# Patient Record
Sex: Male | Born: 2000 | Race: White | Hispanic: No | Marital: Single | Smoking: Never smoker
Health system: Southern US, Community
[De-identification: ages and names within clinical notes are randomized; demographics above are authoritative.]

## PROBLEM LIST (undated history)

## (undated) DIAGNOSIS — F909 Attention-deficit hyperactivity disorder, unspecified type: Secondary | ICD-10-CM

## (undated) HISTORY — DX: Attention-deficit hyperactivity disorder, unspecified type: F90.9

---
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
## 2018-01-20 DIAGNOSIS — R17 Unspecified jaundice: Secondary | ICD-10-CM | POA: Diagnosis not present

## 2018-01-20 DIAGNOSIS — J039 Acute tonsillitis, unspecified: Secondary | ICD-10-CM | POA: Diagnosis not present

## 2018-01-21 ENCOUNTER — Emergency Department (HOSPITAL_COMMUNITY)
Admission: EM | Admit: 2018-01-21 | Discharge: 2018-01-21 | Disposition: A | Discharge status: Home / Self Care | Payer: 59 | Attending: Emergency Medicine | Admitting: Emergency Medicine

## 2018-01-21 ENCOUNTER — Encounter (HOSPITAL_COMMUNITY): Payer: Self-pay

## 2018-01-21 ENCOUNTER — Other Ambulatory Visit: Payer: Self-pay

## 2018-01-21 DIAGNOSIS — R17 Unspecified jaundice: Secondary | ICD-10-CM | POA: Insufficient documentation

## 2018-01-21 DIAGNOSIS — J029 Acute pharyngitis, unspecified: Secondary | ICD-10-CM | POA: Diagnosis not present

## 2018-01-21 DIAGNOSIS — R070 Pain in throat: Secondary | ICD-10-CM | POA: Diagnosis not present

## 2018-01-21 LAB — CBC WITH DIFFERENTIAL/PLATELET
Abs Immature Granulocytes: 0.07 10*3/uL (ref 0.00–0.07)
BASOS ABS: 0 10*3/uL (ref 0.0–0.1)
BASOS PCT: 0 %
EOS ABS: 0.1 10*3/uL (ref 0.0–0.5)
Eosinophils Relative: 0 %
HCT: 48.6 % (ref 39.0–52.0)
Hemoglobin: 15.6 g/dL (ref 13.0–17.0)
IMMATURE GRANULOCYTES: 1 %
Lymphocytes Relative: 15 %
Lymphs Abs: 2.2 10*3/uL (ref 0.7–4.0)
MCH: 29.8 pg (ref 26.0–34.0)
MCHC: 32.1 g/dL (ref 30.0–36.0)
MCV: 92.9 fL (ref 80.0–100.0)
MONOS PCT: 11 %
Monocytes Absolute: 1.6 10*3/uL — ABNORMAL HIGH (ref 0.1–1.0)
NEUTROS PCT: 73 %
NRBC: 0 % (ref 0.0–0.2)
Neutro Abs: 10.4 10*3/uL — ABNORMAL HIGH (ref 1.7–7.7)
PLATELETS: 312 10*3/uL (ref 150–400)
RBC: 5.23 MIL/uL (ref 4.22–5.81)
RDW: 12.1 % (ref 11.5–15.5)
WBC: 14.3 10*3/uL — ABNORMAL HIGH (ref 4.0–10.5)

## 2018-01-21 LAB — COMPREHENSIVE METABOLIC PANEL
ALK PHOS: 90 U/L (ref 38–126)
ALT: 25 U/L (ref 0–44)
ANION GAP: 14 (ref 5–15)
AST: 19 U/L (ref 15–41)
Albumin: 4 g/dL (ref 3.5–5.0)
BILIRUBIN TOTAL: 3.6 mg/dL — AB (ref 0.3–1.2)
BUN: 13 mg/dL (ref 6–20)
CALCIUM: 9.9 mg/dL (ref 8.9–10.3)
CO2: 23 mmol/L (ref 22–32)
Chloride: 100 mmol/L (ref 98–111)
Creatinine, Ser: 1.1 mg/dL (ref 0.61–1.24)
GFR calc non Af Amer: >60 mL/min (ref >60)
Glucose, Bld: 91 mg/dL (ref 70–99)
Potassium: 3.8 mmol/L (ref 3.5–5.1)
Sodium: 137 mmol/L (ref 135–145)
TOTAL PROTEIN: 7.6 g/dL (ref 6.5–8.1)

## 2018-01-21 LAB — MONONUCLEOSIS SCREEN: Mono Screen: NEGATIVE

## 2018-01-21 LAB — I-STAT CG4 LACTIC ACID, ED: Lactic Acid, Venous: 1.32 mmol/L (ref 0.5–1.9)

## 2018-01-21 MED ORDER — MAGIC MOUTHWASH
10.0000 mL | Freq: Once | ORAL | Status: AC
  Administered 2018-01-21: 10 mL via ORAL
  Filled 2018-01-21 (×2): qty 10

## 2018-01-21 MED ORDER — DEXAMETHASONE SODIUM PHOSPHATE 10 MG/ML IJ SOLN
10.0000 mg | Freq: Once | INTRAMUSCULAR | Status: AC
  Administered 2018-01-21: 10 mg via INTRAVENOUS
  Filled 2018-01-21: qty 1

## 2018-01-21 MED ORDER — IBUPROFEN 600 MG PO TABS: 600.0000 mg | ORAL_TABLET | Freq: Four times a day (QID) | ORAL | 0 refills | Status: DC | PRN

## 2018-01-21 MED ORDER — KETOROLAC TROMETHAMINE 15 MG/ML IJ SOLN
15.0000 mg | Freq: Once | INTRAMUSCULAR | Status: AC
  Administered 2018-01-21: 15 mg via INTRAVENOUS
  Filled 2018-01-21: qty 1

## 2018-01-21 NOTE — ED Triage Notes (Signed)
Pt c/o sore throat since X4-5 days, pt was sent her by MD after lab draw for elevated WBC.

## 2018-01-21 NOTE — ED Notes (Signed)
Patient verbalizes understanding of discharge instructions. Opportunity for questioning and answers were provided. Armband removed by staff, pt discharged from ED home via POV.  

## 2018-01-21 NOTE — ED Provider Notes (Signed)
[facility]PART[facility] Provider Note   CSN: [id number] Arrival date & time: 01/21/18  1629     History   Chief Complaint Chief Complaint  Patient presents with  . Sore Throat    HPI Daniel Floyd is a 17 y.o. male.  HPI  17 y/o comes in with cc of sore throat. About 3 days ago patient started having headaches, fevers, sore throat. Pt has been taking OTC medicines w/o significant relief. Sore throat started y'day, and the pain is severe leading to reduced po intake. Pt has no cough, no dib, sick contacts, voice change.   Pt has hx of ADHD but no other medical problem.  Past Medical History:  Diagnosis Date  . ADHD (attention deficit hyperactivity disorder)     There are no active problems to display for this patient.   History reviewed. No pertinent surgical history.      Home Medications    Prior to Admission medications   Medication Sig Start Date End Date Taking? Authorizing Provider  acetaminophen (TYLENOL) 500 MG tablet Take 500 mg by mouth every 6 (six) hours as needed for moderate pain or fever.    Yes [provider]  fluticasone (FLONASE) 50 MCG/ACT nasal spray Place 2 sprays into both nostrils daily. Patient taking differently: Place 2 sprays into both nostrils daily as needed for allergies.  06/01/15  Yes Posey Boyer, MD  ibuprofen (ADVIL,MOTRIN) 600 MG tablet Take 1 tablet (600 mg total) by mouth every 6 (six) hours as needed. 01/21/18   Varney Biles, MD    Family History No family history on file.  Social History Social History   Tobacco Use  . Smoking status: Never Smoker  . Smokeless tobacco: Never Used  Substance Use Topics  . Alcohol use: No  . Drug use: No     Allergies   Patient has no known allergies.   Review of Systems Review of Systems  Constitutional: Positive for activity change and fever.  HENT: Positive for sore throat and trouble swallowing. Negative for drooling, facial  swelling and voice change.   Respiratory: Negative for cough.   Gastrointestinal: Negative for nausea and vomiting.  Allergic/Immunologic: Negative for immunocompromised state.     Physical Exam Updated Vital Signs BP 125/80   Pulse 84   Temp (S) 99.6 F (37.6 C) (Oral) Comment: Pt has had tylenol reported fever at home of 103   Resp 18   Ht 6\' 6"  (1.981 m)   Wt 106.6 kg   SpO2 100%   BMI 27.16 kg/m   Physical Exam  Constitutional: He is oriented to person, place, and time. He appears well-developed.  HENT:  Head: Normocephalic.  Mouth/Throat: Mucous membranes are normal. No uvula swelling. Oropharyngeal exudate and posterior oropharyngeal erythema present. No posterior oropharyngeal edema or tonsillar abscesses.  No meningismus  Eyes: Pupils are equal, round, and reactive to light. EOM are normal.  Icteric sclera  Cardiovascular: Normal rate.  Abdominal: Soft. He exhibits no mass. There is no tenderness.  Lymphadenopathy:    He has cervical adenopathy.  Neurological: He is alert and oriented to person, place, and time.  Skin: Skin is warm.  Nursing note and vitals reviewed.    ED Treatments / Results  Labs (all labs ordered are listed, but only abnormal results are displayed) Labs Reviewed  COMPREHENSIVE METABOLIC PANEL - Abnormal; Notable for the following components:      Result Value   Total Bilirubin 3.6 (*)  All other components within normal limits  CBC WITH DIFFERENTIAL/PLATELET - Abnormal; Notable for the following components:   WBC 14.3 (*)    Neutro Abs 10.4 (*)    Monocytes Absolute 1.6 (*)    All other components within normal limits  MONONUCLEOSIS SCREEN  HEPATITIS PANEL, ACUTE  I-STAT CG4 LACTIC ACID, ED  I-STAT CG4 LACTIC ACID, ED    EKG None  Radiology No results found.  Procedures Procedures (including critical care time)  Medications Ordered in ED Medications  magic mouthwash (10 mLs Oral Given 01/21/18 2048)  dexamethasone  (DECADRON) injection 10 mg (10 mg Intravenous Given 01/21/18 2047)  ketorolac (TORADOL) 15 MG/ML injection 15 mg (15 mg Intravenous Given 01/21/18 2057)     Initial Impression / Assessment and Plan / ED Course  I have reviewed the triage vital signs and the nursing notes.  Pertinent labs & imaging results that were available during my care of the patient were reviewed by me and considered in my medical decision making (see chart for details).     DDX includes: Viral syndrome Influenza Pharyngitis Sinusitis Mononucleosis Hepatitis   17 year old healthy male brought into the ER with chief complaint of abnormal lab.  Patient has been having sore throat and some URI-like symptoms.  He has been started on antibiotics.  He is noted to have slight exudate in the left oropharynx.  Patient was informed to come to the ER after his white count was noted to be over 24,000 along with bilirubin over 5.  Patient's sclera is icteric.  His HEENT bilirubin today is improved along with a white count. There is no meningismus, headaches, mental status change, abdominal tenderness.  Mono has been resent.  Patient is nontoxic-appearing and immunocompetent.  We will send hepatitis panel which can present with URI-like symptoms as well, however at this time this appears to be a sequela of viral infection.  LFTs are reassuring outside of bilirubin. we do not think patient is having hemolysis.  Patient and family have been briefed on the findings and the concern we have.  They will see the PCP in 1 week for repeat blood work.  Stable for discharge at this time.  Final Clinical Impressions(s) / ED Diagnoses   Final diagnoses:  Viral pharyngitis  Jaundice    ED Discharge Orders         Ordered    ibuprofen (ADVIL,MOTRIN) 600 MG tablet  Every 6 hours PRN     01/21/18 2117           Varney Biles, MD 01/23/18 1633

## 2018-01-21 NOTE — Discharge Instructions (Signed)
We suspect that your symptoms of jaundice is because of viral pharyngitis. We have sent out hepatitis panel, however at this time the suspicion is high for viral infection causing the symptoms.  We would like you to see your doctor in 1 week for repeat bilirubin check.

## 2018-01-21 NOTE — ED Notes (Signed)
Pt 's mom st's pt has not been able to eat in several days due to sore throat.  St's she was also told to bring him to ED for IV fluids

## 2018-01-23 LAB — HEPATITIS PANEL, ACUTE
HEP A IGM: NEGATIVE
Hep B C IgM: NEGATIVE
Hepatitis B Surface Ag: NEGATIVE

## 2018-01-25 DIAGNOSIS — R17 Unspecified jaundice: Secondary | ICD-10-CM | POA: Diagnosis not present

## 2018-01-25 DIAGNOSIS — D72829 Elevated white blood cell count, unspecified: Secondary | ICD-10-CM | POA: Diagnosis not present

## 2018-01-25 DIAGNOSIS — J039 Acute tonsillitis, unspecified: Secondary | ICD-10-CM | POA: Diagnosis not present

## 2018-01-25 DIAGNOSIS — R6889 Other general symptoms and signs: Secondary | ICD-10-CM | POA: Diagnosis not present

## 2018-01-26 DIAGNOSIS — J039 Acute tonsillitis, unspecified: Secondary | ICD-10-CM | POA: Diagnosis not present

## 2018-03-26 DIAGNOSIS — Z23 Encounter for immunization: Secondary | ICD-10-CM | POA: Diagnosis not present

## 2018-03-26 DIAGNOSIS — M62830 Muscle spasm of back: Secondary | ICD-10-CM | POA: Diagnosis not present

## 2018-09-08 DIAGNOSIS — R509 Fever, unspecified: Secondary | ICD-10-CM | POA: Diagnosis not present

## 2018-09-08 DIAGNOSIS — J029 Acute pharyngitis, unspecified: Secondary | ICD-10-CM | POA: Diagnosis not present

## 2019-07-10 ENCOUNTER — Encounter (HOSPITAL_COMMUNITY): Payer: Self-pay | Admitting: Emergency Medicine

## 2019-07-10 ENCOUNTER — Ambulatory Visit (HOSPITAL_COMMUNITY)
Admission: EM | Admit: 2019-07-10 | Discharge: 2019-07-10 | Disposition: A | Discharge status: Home / Self Care | Payer: 59 | Attending: Family Medicine | Admitting: Family Medicine

## 2019-07-10 ENCOUNTER — Other Ambulatory Visit: Payer: Self-pay

## 2019-07-10 ENCOUNTER — Ambulatory Visit (INDEPENDENT_AMBULATORY_CARE_PROVIDER_SITE_OTHER): Payer: 59

## 2019-07-10 DIAGNOSIS — S62327A Displaced fracture of shaft of fifth metacarpal bone, left hand, initial encounter for closed fracture: Secondary | ICD-10-CM | POA: Diagnosis not present

## 2019-07-10 DIAGNOSIS — X58XXXA Exposure to other specified factors, initial encounter: Secondary | ICD-10-CM

## 2019-07-10 DIAGNOSIS — S62307A Unspecified fracture of fifth metacarpal bone, left hand, initial encounter for closed fracture: Secondary | ICD-10-CM

## 2019-07-10 MED ORDER — IBUPROFEN 800 MG PO TABS: 800.0000 mg | ORAL_TABLET | Freq: Three times a day (TID) | ORAL | 0 refills | Status: AC

## 2019-07-10 NOTE — Discharge Instructions (Addendum)
Ice Elevate No use of hand Call orthopedic tomorrow to set up an appointment for next week, Dr Stann Mainland of [facility] is on call They see patients on a walk in basis daily Leave brace on, do not remove until you see the orthopedic surgeon
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
## 2020-03-26 DIAGNOSIS — T1511XA Foreign body in conjunctival sac, right eye, initial encounter: Secondary | ICD-10-CM | POA: Diagnosis not present

## 2020-03-26 DIAGNOSIS — H11431 Conjunctival hyperemia, right eye: Secondary | ICD-10-CM | POA: Diagnosis not present

## 2021-04-16 DIAGNOSIS — R110 Nausea: Secondary | ICD-10-CM | POA: Diagnosis not present

## 2021-12-24 IMAGING — DX DG HAND COMPLETE 3+V*L*
3 series · 3 of 3 positions shown · non-contrast
Comparison: None.

CLINICAL DATA: Left hand injury today

EXAM:
LEFT HAND - COMPLETE 3+ VIEW

[hand pa]
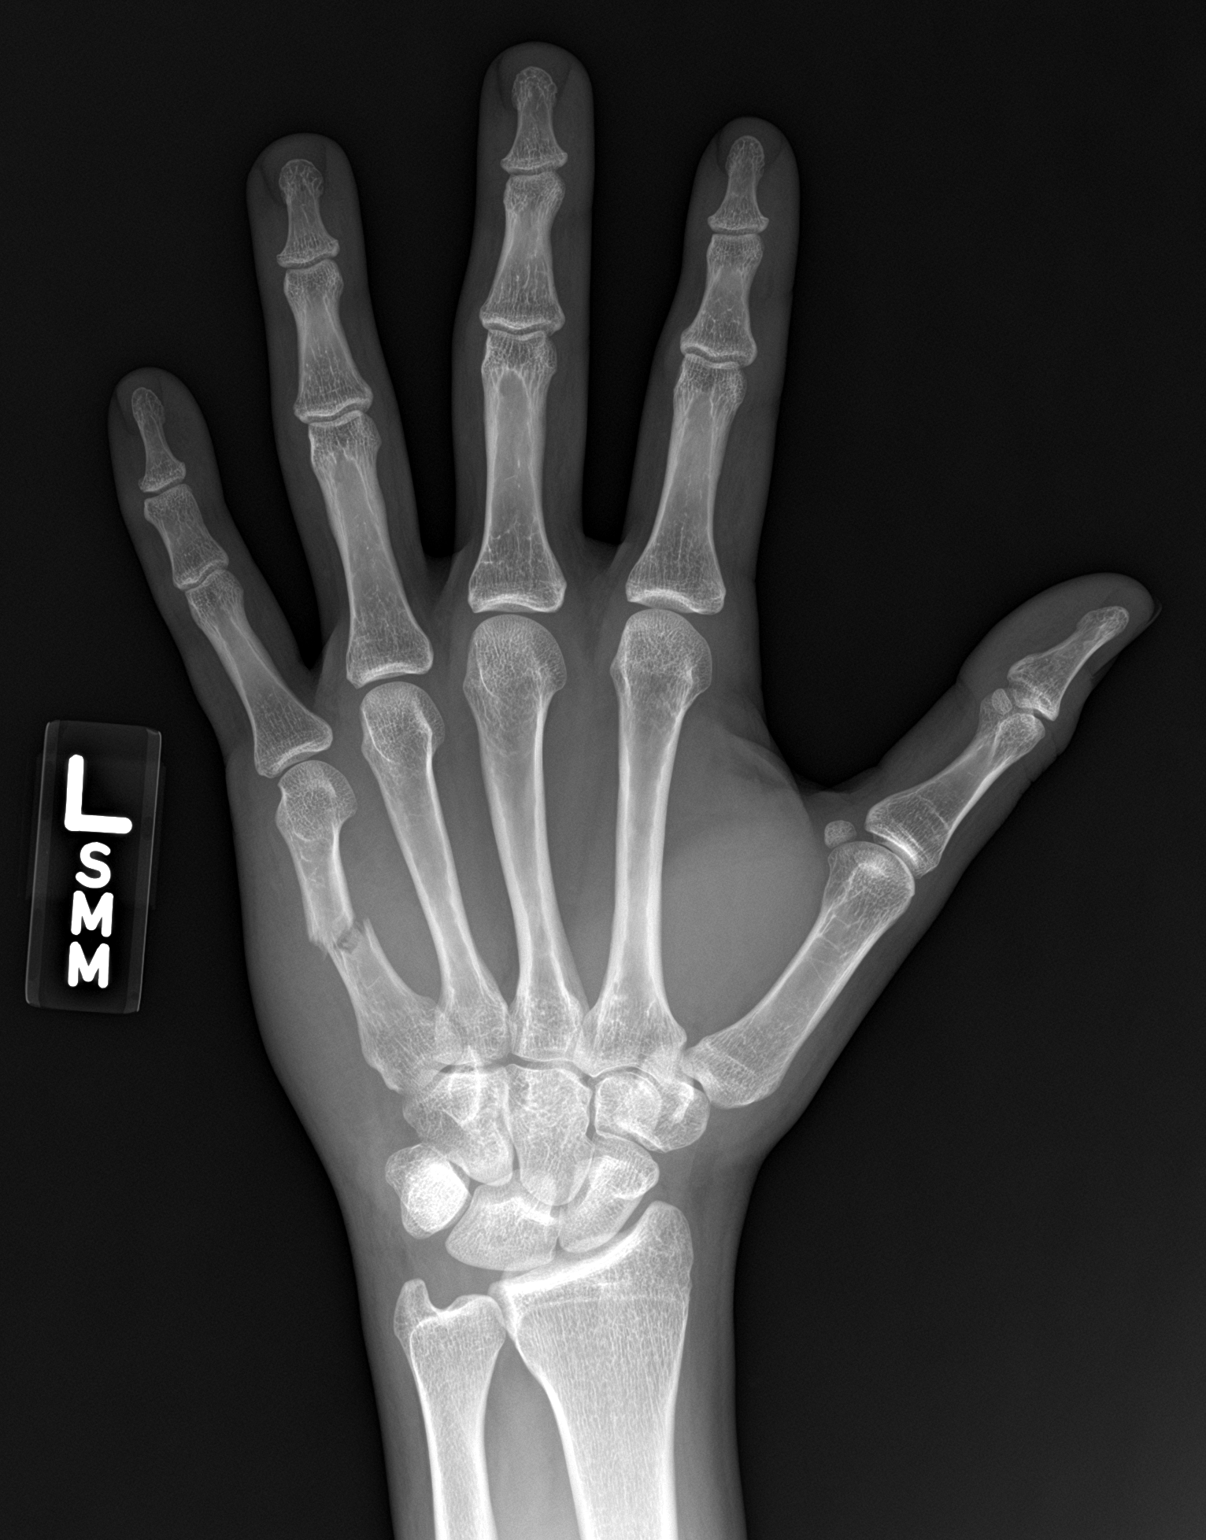

[hand obl]
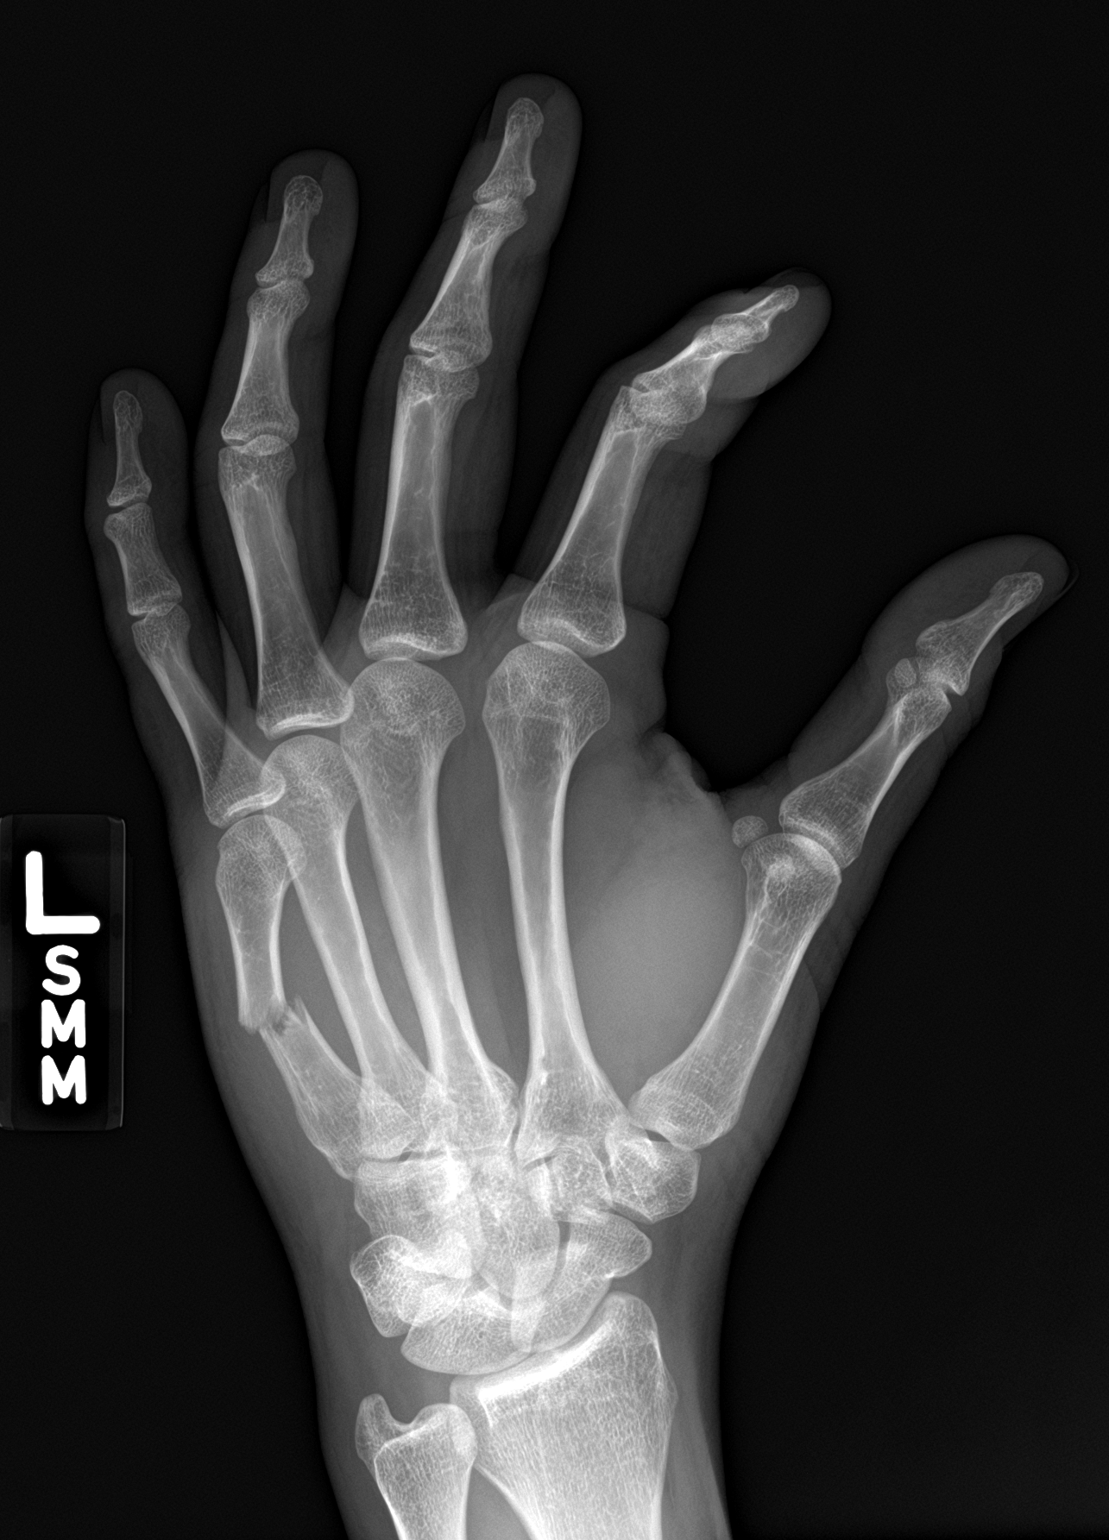

[hand lat]
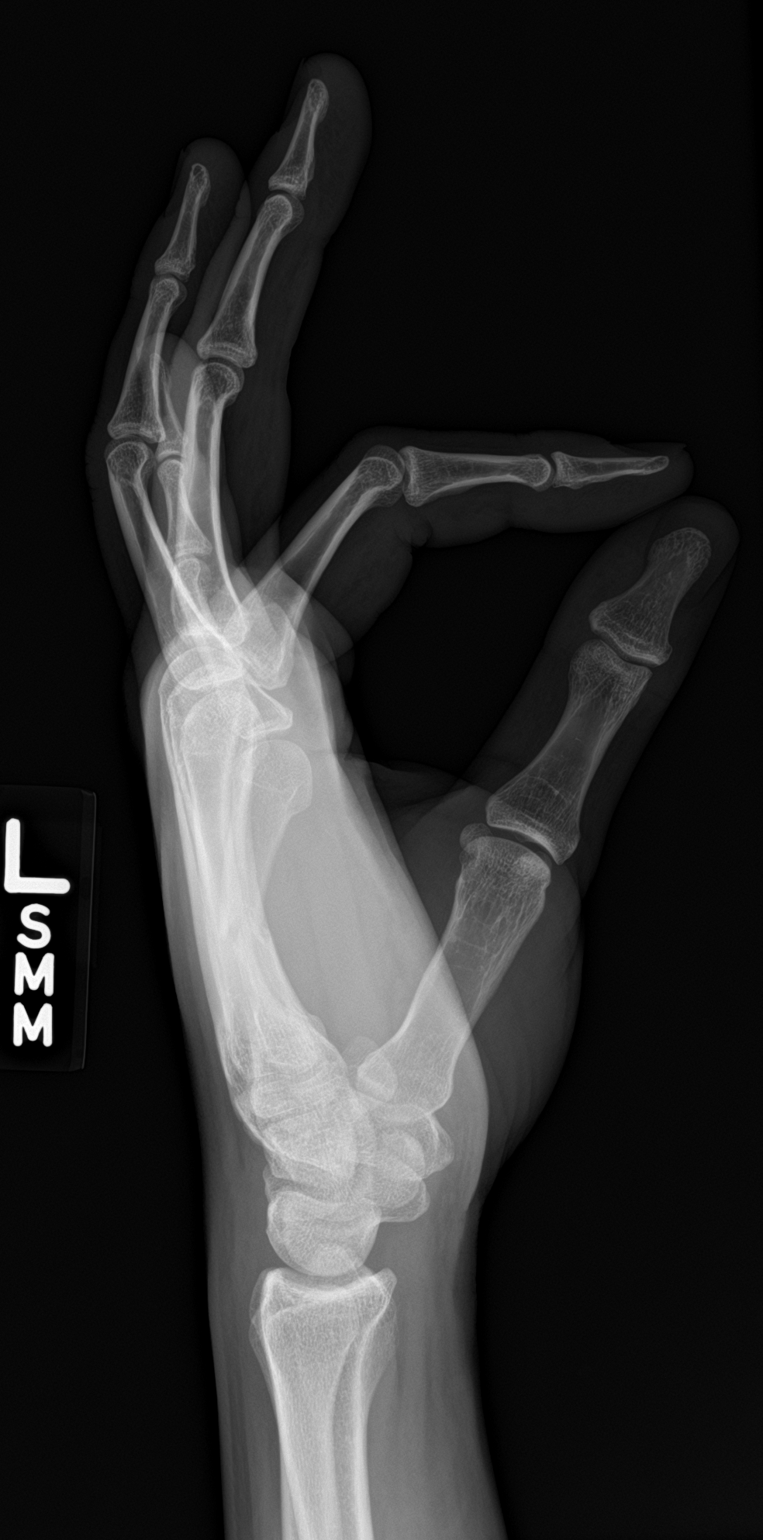

[3 of 3 positions shown; findings below may reference images not displayed]

FINDINGS: Non articular acute left mid fifth metacarpal shaft fracture with 2
mm ulnar displacement of the distal fracture fragment and mild apex
dorsal angulation, with surrounding soft tissue swelling. No
additional fractures. No dislocation. No focal osseous lesions. No
significant arthropathy. No radiopaque foreign body.
IMPRESSION: Left fifth metacarpal shaft fracture as detailed.

## 2022-07-07 ENCOUNTER — Emergency Department (HOSPITAL_COMMUNITY)
Admission: EM | Admit: 2022-07-07 | Discharge: 2022-07-07 | Disposition: A | Discharge status: Home / Self Care | Payer: BC Managed Care – PPO | Attending: Emergency Medicine | Admitting: Emergency Medicine

## 2022-07-07 ENCOUNTER — Emergency Department (HOSPITAL_COMMUNITY): Payer: BC Managed Care – PPO

## 2022-07-07 DIAGNOSIS — R079 Chest pain, unspecified: Secondary | ICD-10-CM | POA: Diagnosis not present

## 2022-07-07 DIAGNOSIS — R0789 Other chest pain: Secondary | ICD-10-CM | POA: Diagnosis not present

## 2022-07-07 LAB — BASIC METABOLIC PANEL
Anion gap: 10 (ref 5–15)
BUN: 12 mg/dL (ref 6–20)
CO2: 23 mmol/L (ref 22–32)
Calcium: 8.9 mg/dL (ref 8.9–10.3)
Chloride: 105 mmol/L (ref 98–111)
Creatinine, Ser: 0.81 mg/dL (ref 0.61–1.24)
GFR, Estimated: >60 mL/min (ref >60)
Glucose, Bld: 92 mg/dL (ref 70–99)
Potassium: 4.2 mmol/L (ref 3.5–5.1)
Sodium: 138 mmol/L (ref 135–145)

## 2022-07-07 LAB — TROPONIN I (HIGH SENSITIVITY)
Troponin I (High Sensitivity): 3 ng/L (ref <18)
Troponin I (High Sensitivity): <2 ng/L (ref <18)

## 2022-07-07 LAB — CBC
HCT: 47.4 % (ref 39.0–52.0)
Hemoglobin: 15.9 g/dL (ref 13.0–17.0)
MCH: 31.1 pg (ref 26.0–34.0)
MCHC: 33.5 g/dL (ref 30.0–36.0)
MCV: 92.8 fL (ref 80.0–100.0)
Platelets: 233 10*3/uL (ref 150–400)
RBC: 5.11 MIL/uL (ref 4.22–5.81)
RDW: 12.3 % (ref 11.5–15.5)
WBC: 4.5 10*3/uL (ref 4.0–10.5)
nRBC: 0 % (ref 0.0–0.2)

## 2022-07-07 LAB — D-DIMER, QUANTITATIVE: D-Dimer, Quant: <0.27 ug/mL-FEU (ref 0.00–0.50)

## 2022-07-07 MED ORDER — ASPIRIN 81 MG PO CHEW
324.0000 mg | CHEWABLE_TABLET | Freq: Once | ORAL | Status: AC
  Administered 2022-07-07: 324 mg via ORAL
  Filled 2022-07-07: qty 4

## 2022-07-07 NOTE — Discharge Instructions (Signed)
You were seen in the emergency department for some left-sided chest pain.  You had blood work EKG chest x-ray that did not show any evidence of heart attack, blood clot, or pneumonia.  This is likely muscular and you can try ibuprofen up to 3 times a day.  Follow-up with your regular doctor.  Return to the emergency department if any worsening or concerning symptoms
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
## 2022-07-07 NOTE — ED Provider Notes (Signed)
[facility] Provider Note   CSN: [id number] Arrival date & time: 07/07/22  [id number]     History  Chief Complaint  Patient presents with   Chest Pain    Daniel Floyd is a 22 y.o. male.  He has no significant past medical history.  He said he was driving this morning around 8 AM when he started experiencing a sharp stabbing central chest pain radiating to his left pectoral area.  He said it comes in waves although never completely goes away.  He does not feel short of breath but he said when he takes a maximum inhalation it does get a little worse.  No dizziness numbness weakness.  No radiation to the back or the abdomen.  This is never happened before.  He does not smoke or use drugs, does not recall any trauma.  Has tried nothing for it.  The history is provided by the patient.  Chest Pain Pain location:  Substernal area and L chest Pain quality: sharp and stabbing   Pain severity:  Moderate Onset quality:  Sudden Duration:  2 hours Timing:  Constant Progression:  Waxing and waning Chronicity:  New Relieved by:  None tried Worsened by:  Deep breathing Ineffective treatments:  None tried Associated symptoms: no abdominal pain, no back pain, no cough, no diaphoresis, no fever, no nausea, no numbness, no shortness of breath, no vomiting and no weakness   Risk factors: no prior DVT/PE and no smoking        Home Medications Prior to Admission medications   Medication Sig Start Date End Date Taking? Authorizing Provider  acetaminophen (TYLENOL) 500 MG tablet Take 500 mg by mouth every 6 (six) hours as needed for moderate pain or fever.     [provider]  fluticasone (FLONASE) 50 MCG/ACT nasal spray Place 2 sprays into both nostrils daily. Patient taking differently: Place 2 sprays into both nostrils daily as needed for allergies.  06/01/15   Posey Boyer, MD  ibuprofen (ADVIL) 800 MG tablet Take 1 tablet (800 mg total) by  mouth 3 (three) times daily. 07/10/19   Raylene Everts, MD      Allergies    Patient has no known allergies.    Review of Systems   Review of Systems  Constitutional:  Negative for diaphoresis and fever.  Eyes:  Negative for visual disturbance.  Respiratory:  Negative for cough and shortness of breath.   Cardiovascular:  Positive for chest pain.  Gastrointestinal:  Negative for abdominal pain, nausea and vomiting.  Musculoskeletal:  Negative for back pain and neck pain.  Neurological:  Negative for weakness and numbness.    Physical Exam Updated Vital Signs BP (!) 120/59 (BP Location: Right Arm)   Pulse (!) 57   Temp 97.7 F (36.5 C) (Oral)   Resp 12   SpO2 100%  Physical Exam Vitals and nursing note reviewed.  Constitutional:      General: He is not in acute distress.    Appearance: He is well-developed.  HENT:     Head: Normocephalic and atraumatic.  Eyes:     Conjunctiva/sclera: Conjunctivae normal.  Cardiovascular:     Rate and Rhythm: Normal rate and regular rhythm.     Heart sounds: Normal heart sounds. No murmur heard. Pulmonary:     Effort: Pulmonary effort is normal. No respiratory distress.     Breath sounds: Normal breath sounds.  Abdominal:     Palpations: Abdomen is soft.  Tenderness: There is no abdominal tenderness.  Musculoskeletal:        General: No swelling.     Cervical back: Neck supple.     Right lower leg: No tenderness. No edema.     Left lower leg: No tenderness. No edema.  Skin:    General: Skin is warm and dry.     Capillary Refill: Capillary refill takes less than 2 seconds.  Neurological:     General: No focal deficit present.     Mental Status: He is alert.     ED Results / Procedures / Treatments   Labs (all labs ordered are listed, but only abnormal results are displayed) Labs Reviewed  BASIC METABOLIC PANEL  CBC  D-DIMER, QUANTITATIVE  TROPONIN I (HIGH SENSITIVITY)  TROPONIN I (HIGH SENSITIVITY)    EKG EKG  Interpretation  Date/Time:  Monday July 07 2022 09:41:26 EDT Ventricular Rate:  56 PR Interval:  175 QRS Duration: 89 QT Interval:  416 QTC Calculation: 402 R Axis:   73 Text Interpretation: Sinus rhythm No old tracing to compare Confirmed by Aletta Edouard [phone]) on 07/07/2022 9:43:38 AM  Radiology DG Chest 2 View  Result Date: 07/07/2022 CLINICAL DATA:  Chest pain EXAM: CHEST - 2 VIEW COMPARISON:  None Available. FINDINGS: The heart size and mediastinal contours are within normal limits. Both lungs are clear. The visualized skeletal structures are unremarkable. IMPRESSION: No active cardiopulmonary disease. Electronically Signed   By: Dorise Bullion III M.D.   On: 07/07/2022 10:20    Procedures Procedures    Medications Ordered in ED Medications - No data to display  ED Course/ Medical Decision Making/ A&P Clinical Course as of 07/07/22 1756  Mon Jul 07, 2022  1025 Chest x-ray interpreted by me as no acute infiltrates no pneumothorax.  Awaiting radiology reading. [MB]  1238 Updated patient on results of testing.  He understands he will need a second troponin.  He is resting comfortably stable vitals. [MB]    Clinical Course User Index [MB] Hayden Rasmussen, MD                             Medical Decision Making Amount and/or Complexity of Data Reviewed Labs: ordered. Radiology: ordered.  Risk OTC drugs.   This patient complains of left-sided sharp chest pain; this involves an extensive number of treatment Options and is a complaint that carries with it a high risk of complications and morbidity. The differential includes ACS, pneumonia, pleurisy, PE, vascular, musculoskeletal, reflux  I ordered, reviewed and interpreted labs, which included CBC normal chemistries normal troponins flat D-dimer negative I ordered medication oral aspirin and reviewed PMP when indicated. I ordered imaging studies which included chest x-ray and I independently    visualized and  interpreted imaging which showed no acute findings Additional history obtained from patient's companion  Previous records obtained and reviewed no significant barriers Cardiac monitoring reviewed, normal sinus rhythm Social determinants considered, no significant barriers Critical Interventions: None  After the interventions stated above, I reevaluated the patient and found patient to be symptomatically improved and hemodynamically stable Admission and further testing considered, no indications for admission or further workup at this time.  Recommended trial of NSAIDs and close follow-up with PCP.  Return instructions discussed         Final Clinical Impression(s) / ED Diagnoses Final diagnoses:  Nonspecific chest pain    Rx / DC Orders ED Discharge Orders  None         Hayden Rasmussen, MD 07/07/22 Carollee Massed

## 2023-01-06 DIAGNOSIS — M5451 Vertebrogenic low back pain: Secondary | ICD-10-CM | POA: Diagnosis not present

## 2023-02-02 DIAGNOSIS — R197 Diarrhea, unspecified: Secondary | ICD-10-CM | POA: Diagnosis not present

## 2023-09-03 DIAGNOSIS — J069 Acute upper respiratory infection, unspecified: Secondary | ICD-10-CM | POA: Diagnosis not present

## 2024-01-21 DIAGNOSIS — G4730 Sleep apnea, unspecified: Secondary | ICD-10-CM | POA: Diagnosis not present

## 2024-01-21 DIAGNOSIS — M2550 Pain in unspecified joint: Secondary | ICD-10-CM | POA: Diagnosis not present

## 2024-03-08 ENCOUNTER — Other Ambulatory Visit: Payer: Self-pay | Admitting: Family Medicine

## 2024-03-08 DIAGNOSIS — R519 Headache, unspecified: Secondary | ICD-10-CM

## 2024-03-08 DIAGNOSIS — R202 Paresthesia of skin: Secondary | ICD-10-CM | POA: Diagnosis not present

## 2024-04-11 ENCOUNTER — Encounter: Payer: Self-pay | Admitting: Family Medicine

## 2024-04-17 ENCOUNTER — Other Ambulatory Visit

## 2024-04-17 DIAGNOSIS — R519 Headache, unspecified: Secondary | ICD-10-CM

## 2024-04-17 MED ORDER — GADOPICLENOL 0.5 MMOL/ML IV SOLN
10.0000 mL | Freq: Once | INTRAVENOUS | Status: AC | PRN
  Administered 2024-04-17: 10 mL via INTRAVENOUS

## 2024-07-05 ENCOUNTER — Ambulatory Visit: Admitting: Neurology
